# Patient Record
Sex: Male | Born: 1985 | Race: White | Hispanic: No | Marital: Married | Smoking: Never smoker
Health system: Southern US, Community
[De-identification: ages and names within clinical notes are randomized; demographics above are authoritative.]

## PROBLEM LIST (undated history)

## (undated) DIAGNOSIS — G35 Multiple sclerosis: Secondary | ICD-10-CM

## (undated) DIAGNOSIS — G4730 Sleep apnea, unspecified: Secondary | ICD-10-CM

## (undated) HISTORY — PX: TONSILLECTOMY: SUR1361

## (undated) HISTORY — DX: Sleep apnea, unspecified: G47.30

## (undated) HISTORY — DX: Multiple sclerosis: G35

---

## 2009-02-11 ENCOUNTER — Emergency Department: Payer: Self-pay | Admitting: Emergency Medicine

## 2010-11-26 ENCOUNTER — Ambulatory Visit: Payer: Self-pay | Admitting: Ophthalmology

## 2013-12-27 ENCOUNTER — Emergency Department: Payer: Self-pay | Admitting: Emergency Medicine

## 2014-03-26 ENCOUNTER — Emergency Department: Payer: Self-pay | Admitting: Emergency Medicine

## 2014-12-19 ENCOUNTER — Encounter: Payer: Self-pay | Admitting: *Deleted

## 2014-12-19 ENCOUNTER — Emergency Department
Admission: EM | Admit: 2014-12-19 | Discharge: 2014-12-19 | Disposition: A | Discharge status: Home / Self Care | Payer: BLUE CROSS/BLUE SHIELD | Attending: Emergency Medicine | Admitting: Emergency Medicine

## 2014-12-19 DIAGNOSIS — R202 Paresthesia of skin: Secondary | ICD-10-CM | POA: Diagnosis not present

## 2014-12-19 DIAGNOSIS — R200 Anesthesia of skin: Secondary | ICD-10-CM | POA: Diagnosis present

## 2014-12-19 DIAGNOSIS — Z79899 Other long term (current) drug therapy: Secondary | ICD-10-CM | POA: Diagnosis not present

## 2014-12-19 DIAGNOSIS — Z880 Allergy status to penicillin: Secondary | ICD-10-CM | POA: Insufficient documentation

## 2014-12-19 NOTE — ED Provider Notes (Signed)
[facility] Emergency Department Provider Note  ____________________________________________  Time seen: Approximately 11 PM  I have reviewed the triage vital signs and the nursing notes.   HISTORY  Chief Complaint Numbness    HPI Franklin Ortiz is a 29 y.o. male with a history of optic neuritis in his left eye 2 years ago who presents today with worsening lower extremity bilateral numbness.  Patient said he went to the beach last week and started having numbness and tingling to the bilateral feet laterally. Over the past week this weakness has extended up his bilateral legs outside of the legs. He denies any weakness or back pain. He denies any recent diarrheal illness or vaccinations. No formal diagnosis of MS. York Spaniel had a workup with MRI several years ago which were negative. No loss of bowel or bladder continence.   History reviewed. No pertinent past medical history.  There are no active problems to display for this patient.   History reviewed. No pertinent past surgical history.  Current Outpatient Rx  Name  Route  Sig  Dispense  Refill  . loratadine (CLARITIN) 10 MG tablet   Oral   Take 10 mg by mouth daily as needed for allergies.         . multivitamin (ONE-A-DAY MEN'S) TABS tablet   Oral   Take 1 tablet by mouth daily.           Allergies Ibuprofen; Penicillins; and Sulfa antibiotics  History reviewed. No pertinent family history.  Social History History  Substance Use Topics  . Smoking status: Never Smoker   . Smokeless tobacco: Not on file  . Alcohol Use: Yes    Review of Systems  Constitutional: No fever/chills Eyes: No visual changes. ENT: No sore throat. Cardiovascular: Denies chest pain. Respiratory: Denies shortness of breath. Gastrointestinal: No abdominal pain.  No nausea   BP 12/19/14 1023 119/106 mmHg     Pulse Rate 12/19/14 1023 82     Resp 12/19/14 1023 17     Temp 12/19/14 1023 98.1 F (36.7 C)   Temp Source 12/19/14 1023 Oral     SpO2 12/19/14 1023 98 %     Weight 12/19/14 1023 268 lb (121.564 kg)     Height 12/19/14 1023 6' (1.829 m)     Head Cir --      Peak Flow --      Pain Score --      Pain Loc --      Pain Edu? --      Excl. in GC? --     Constitutional: Alert and oriented. Well appearing and in no acute distress. Eyes: Conjunctivae are normal. PERRL. EOMI. Head: Atraumatic. Nose: No congestion/rhinnorhea. Mouth/Throat: Mucous membranes are moist.  Oropharynx non-erythematous. Neck: No stridor.   Cardiovascular: Normal rate, regular rhythm. Grossly normal heart sounds.  Good peripheral circulation. Respiratory: Normal respiratory effort.  No retractions. Lungs CTAB. Gastrointestinal: Soft and nontender. No distention. No abdominal bruits. No CVA tenderness. Musculoskeletal: No lower extremity tenderness nor edema.  No joint effusions. Neurologic:  Normal speech and language. speech and gait are normal. Has mildly decreased sensation to light touch to bilateral lower extremities laterally and medially. Mild distal saddle anesthesia. No tenderness to lower back. There is a negative straight leg raise bilaterally. Bilateral 5 out of 5 strength to the lower extremities.  Skin:  Skin is warm, dry and intact. No rash noted. Psychiatric: Mood and affect are normal. Speech and behavior are normal.  ____________________________________________  LABS (all labs ordered are listed, but only abnormal results are displayed)  Labs Reviewed - No data to display ____________________________________________  EKG   ____________________________________________  RADIOLOGY    ____________________________________________   PROCEDURES    ____________________________________________   INITIAL IMPRESSION / ASSESSMENT AND PLAN / ED COURSE  Pertinent labs & imaging results that were available during my care of the patient were reviewed by me and considered in my medical  decision making (see chart for details).  Discussed case with Dr. Loretha Brasil.  Does not recommend any steroids at this time. Appropriate for outpatient workup given symptomatology. Will follow-up with Dr. Clelia Croft in the clinic. Discussed this with patient and family who is willing to comply with this plan. Discussed the patient does have about a 40% chance of MS. I believe this is the primary cause of the symptoms at this time. ____________________________________________   FINAL CLINICAL IMPRESSION(S) / ED DIAGNOSES Acute paresthesia, initial visit.     Arelia Longest, MD 12/19/14 [phone]

## 2014-12-19 NOTE — ED Notes (Signed)
Pt states while at the beach last wedneday having burning sensation in BL feet and since has progessed, this morning woke with numbness/loss of sensation from waist down..denies injury or pain..motor funciton intact

## 2014-12-26 DIAGNOSIS — R29898 Other symptoms and signs involving the musculoskeletal system: Secondary | ICD-10-CM | POA: Insufficient documentation

## 2014-12-26 DIAGNOSIS — Z8669 Personal history of other diseases of the nervous system and sense organs: Secondary | ICD-10-CM | POA: Insufficient documentation

## 2014-12-26 DIAGNOSIS — R200 Anesthesia of skin: Secondary | ICD-10-CM | POA: Insufficient documentation

## 2014-12-27 ENCOUNTER — Other Ambulatory Visit: Payer: Self-pay | Admitting: Neurology

## 2014-12-27 DIAGNOSIS — M6281 Muscle weakness (generalized): Secondary | ICD-10-CM

## 2014-12-27 DIAGNOSIS — R200 Anesthesia of skin: Secondary | ICD-10-CM

## 2014-12-27 DIAGNOSIS — Z8669 Personal history of other diseases of the nervous system and sense organs: Secondary | ICD-10-CM

## 2014-12-31 ENCOUNTER — Ambulatory Visit: Admission: RE | Admit: 2014-12-31 | Payer: BLUE CROSS/BLUE SHIELD | Source: Ambulatory Visit

## 2015-01-02 ENCOUNTER — Ambulatory Visit
Admission: RE | Admit: 2015-01-02 | Discharge: 2015-01-02 | Disposition: A | Discharge status: Home / Self Care | Payer: BLUE CROSS/BLUE SHIELD | Source: Ambulatory Visit | Attending: Neurology | Admitting: Neurology

## 2015-01-02 DIAGNOSIS — R200 Anesthesia of skin: Secondary | ICD-10-CM | POA: Diagnosis present

## 2015-01-02 DIAGNOSIS — J329 Chronic sinusitis, unspecified: Secondary | ICD-10-CM | POA: Diagnosis not present

## 2015-01-02 DIAGNOSIS — R9089 Other abnormal findings on diagnostic imaging of central nervous system: Secondary | ICD-10-CM | POA: Diagnosis not present

## 2015-01-02 DIAGNOSIS — Z8669 Personal history of other diseases of the nervous system and sense organs: Secondary | ICD-10-CM | POA: Insufficient documentation

## 2015-01-02 DIAGNOSIS — M6281 Muscle weakness (generalized): Secondary | ICD-10-CM | POA: Diagnosis present

## 2015-01-02 MED ORDER — GADOBENATE DIMEGLUMINE 529 MG/ML IV SOLN
20.0000 mL | Freq: Once | INTRAVENOUS | Status: AC | PRN
  Administered 2015-01-02: 20 mL via INTRAVENOUS

## 2015-01-03 ENCOUNTER — Other Ambulatory Visit: Payer: Self-pay | Admitting: Neurology

## 2015-01-03 DIAGNOSIS — R29898 Other symptoms and signs involving the musculoskeletal system: Secondary | ICD-10-CM

## 2015-01-11 ENCOUNTER — Ambulatory Visit
Admission: RE | Admit: 2015-01-11 | Discharge: 2015-01-11 | Disposition: A | Discharge status: Home / Self Care | Payer: BLUE CROSS/BLUE SHIELD | Source: Ambulatory Visit | Attending: Neurology | Admitting: Neurology

## 2015-01-11 DIAGNOSIS — R200 Anesthesia of skin: Secondary | ICD-10-CM | POA: Insufficient documentation

## 2015-01-11 DIAGNOSIS — F1010 Alcohol abuse, uncomplicated: Secondary | ICD-10-CM | POA: Insufficient documentation

## 2015-01-11 DIAGNOSIS — R9089 Other abnormal findings on diagnostic imaging of central nervous system: Secondary | ICD-10-CM | POA: Insufficient documentation

## 2015-01-11 DIAGNOSIS — R29898 Other symptoms and signs involving the musculoskeletal system: Secondary | ICD-10-CM

## 2015-01-11 DIAGNOSIS — Z8669 Personal history of other diseases of the nervous system and sense organs: Secondary | ICD-10-CM | POA: Insufficient documentation

## 2015-01-11 LAB — CBC
HCT: 44.7 % (ref 40.0–52.0)
Hemoglobin: 15.4 g/dL (ref 13.0–18.0)
MCH: 29.7 pg (ref 26.0–34.0)
MCHC: 34.4 g/dL (ref 32.0–36.0)
MCV: 86.3 fL (ref 80.0–100.0)
Platelets: 224 10*3/uL (ref 150–440)
RBC: 5.18 MIL/uL (ref 4.40–5.90)
RDW: 13.2 % (ref 11.5–14.5)
WBC: 7.3 10*3/uL (ref 3.8–10.6)

## 2015-01-11 LAB — CSF CELL COUNT WITH DIFFERENTIAL
Lymphs, CSF: 91 % — ABNORMAL HIGH (ref 40–80)
Monocyte-Macrophage-Spinal Fluid: 3 % — ABNORMAL LOW (ref 15–45)
RBC Count, CSF: 951 /mm3 — ABNORMAL HIGH
Segmented Neutrophils-CSF: 6 % (ref 0–6)
Tube #: 3
WBC, CSF: 35 /mm3 (ref 0–5)

## 2015-01-11 LAB — PROTIME-INR
INR: 0.89
Prothrombin Time: 12.3 seconds (ref 11.4–15.0)

## 2015-01-11 LAB — GLUCOSE, CSF: Glucose, CSF: 55 mg/dL (ref 40–70)

## 2015-01-11 LAB — PROTEIN, CSF: Total  Protein, CSF: 66 mg/dL — ABNORMAL HIGH (ref 15–45)

## 2015-01-11 LAB — ALBUMIN: Albumin: 3.9 g/dL (ref 3.5–5.0)

## 2015-01-12 LAB — PATHOLOGIST SMEAR REVIEW

## 2015-01-12 LAB — IGG: IgG (Immunoglobin G), Serum: 1207 mg/dL (ref 700–1600)

## 2015-01-13 LAB — MISC LABCORP TEST (SEND OUT)
Labcorp test code: 139340
Labcorp test code: 19216

## 2015-01-14 LAB — MISC LABCORP TEST (SEND OUT): Labcorp test code: 828928

## 2015-01-16 LAB — CSF CULTURE W GRAM STAIN: Culture: NO GROWTH

## 2015-01-16 LAB — CSF CULTURE

## 2016-01-02 ENCOUNTER — Other Ambulatory Visit: Payer: Self-pay | Admitting: Physician Assistant

## 2016-01-02 DIAGNOSIS — G35 Multiple sclerosis: Secondary | ICD-10-CM

## 2016-01-16 ENCOUNTER — Ambulatory Visit
Admission: RE | Admit: 2016-01-16 | Discharge: 2016-01-16 | Disposition: A | Discharge status: Home / Self Care | Payer: BLUE CROSS/BLUE SHIELD | Source: Ambulatory Visit | Attending: Physician Assistant | Admitting: Physician Assistant

## 2016-01-16 DIAGNOSIS — M50223 Other cervical disc displacement at C6-C7 level: Secondary | ICD-10-CM | POA: Insufficient documentation

## 2016-01-16 DIAGNOSIS — G35 Multiple sclerosis: Secondary | ICD-10-CM | POA: Diagnosis present

## 2016-01-16 DIAGNOSIS — M5124 Other intervertebral disc displacement, thoracic region: Secondary | ICD-10-CM | POA: Diagnosis not present

## 2016-01-16 MED ORDER — GADOBENATE DIMEGLUMINE 529 MG/ML IV SOLN
20.0000 mL | Freq: Once | INTRAVENOUS | Status: AC | PRN
  Administered 2016-01-16: 20 mL via INTRAVENOUS

## 2016-01-30 ENCOUNTER — Ambulatory Visit
Admission: RE | Admit: 2016-01-30 | Discharge: 2016-01-30 | Disposition: A | Discharge status: Home / Self Care | Payer: BLUE CROSS/BLUE SHIELD | Source: Ambulatory Visit | Attending: Physician Assistant | Admitting: Physician Assistant

## 2016-01-30 DIAGNOSIS — G35 Multiple sclerosis: Secondary | ICD-10-CM | POA: Diagnosis not present

## 2016-01-30 MED ORDER — GADOBENATE DIMEGLUMINE 529 MG/ML IV SOLN
20.0000 mL | Freq: Once | INTRAVENOUS | Status: AC | PRN
  Administered 2016-01-30: 20 mL via INTRAVENOUS

## 2016-07-30 IMAGING — MR MR HEAD WO/W CM
13 series · 48 of 48 positions shown · IV contrast (20ml Multihance)
Comparison: MRI of the brain and orbits 11/26/2010.

CLINICAL DATA: Numbness in both lower extremity for 3 weeks. Muscle
weakness. Personal history of optic neuritis.

EXAM:
MRI HEAD WITHOUT AND WITH CONTRAST
TECHNIQUE: Multiplanar, multiecho pulse sequences of the brain and surrounding
structures were obtained without and with intravenous contrast.
CONTRAST:  20mL MULTIHANCE GADOBENATE DIMEGLUMINE 529 MG/ML IV SOLN

[Series 2: T1 · sagittal · 5.0mm · 0.49mm/px · 2 of 29 slices shown (1 of 2)]
[im 1/29]
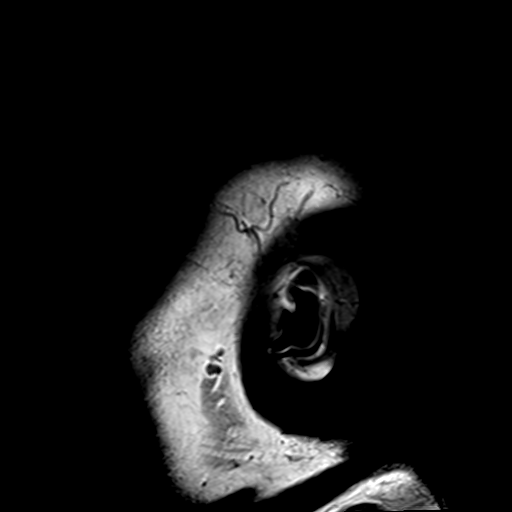
[im 29/29]
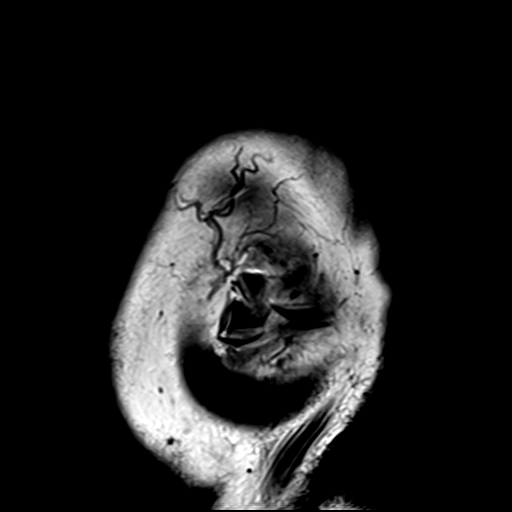

[Series 4: DWI · axial · 3.0mm · 1.95mm/px · z∈[-55,+116]mm · 5 of 59 slices shown (1 of 4)]
[im 1/59]
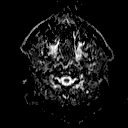
[im 15/59]
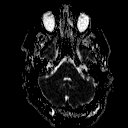
[im 30/59]
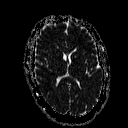
[im 44/59]
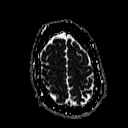
[im 59/59]
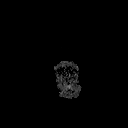

[Series 6: DWI · coronal · 3.0mm · 1.80mm/px · 5 of 55 slices shown (2 of 4)]
[im 1/55]
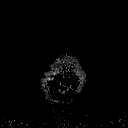
[im 14/55]
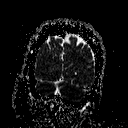
[im 28/55]
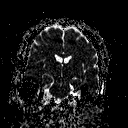
[im 41/55]
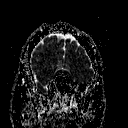
[im 55/55]
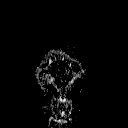

[Series 7: T2 · axial · 5.0mm · 0.65mm/px · z∈[-60,+107]mm · 2 of 27 slices shown (1 of 2)]
[im 1/27]
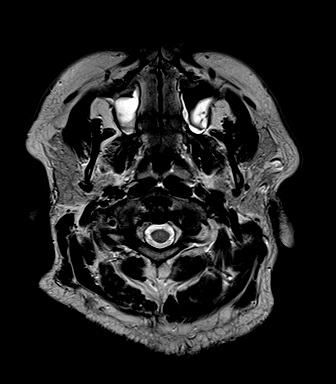
[im 27/27]
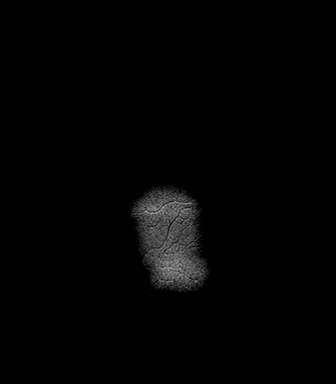

[Series 8: FLAIR · axial · 5.0mm · 0.49mm/px · z∈[-59,+108]mm · 2 of 27 slices shown (1 of 2)]
[im 1/27]
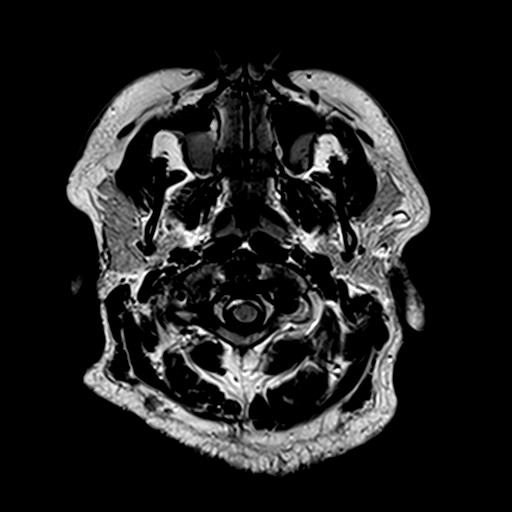
[im 27/27]
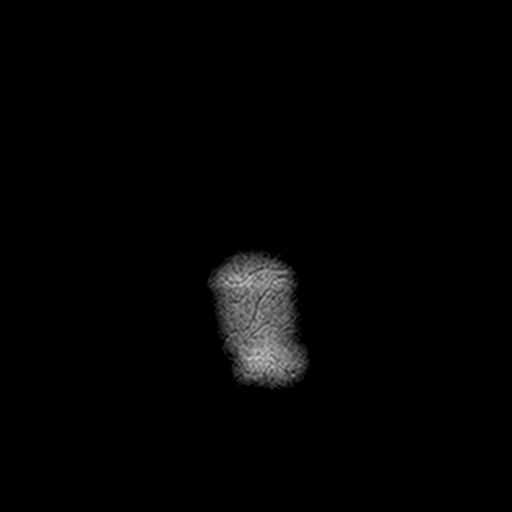

[Series 9: FLAIR · sagittal · 5.0mm · 0.49mm/px · 2 of 25 slices shown (2 of 2)]
[im 1/25]
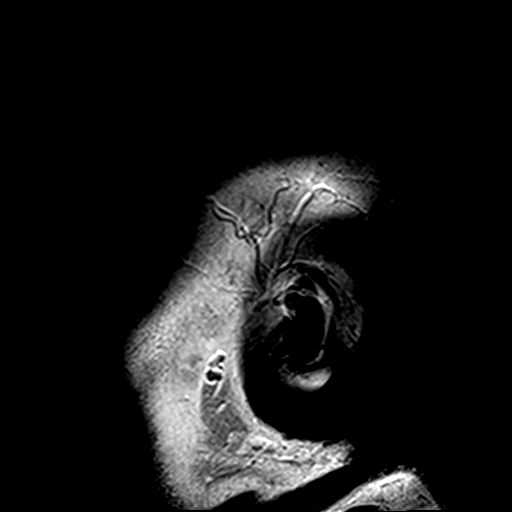
[im 25/25]
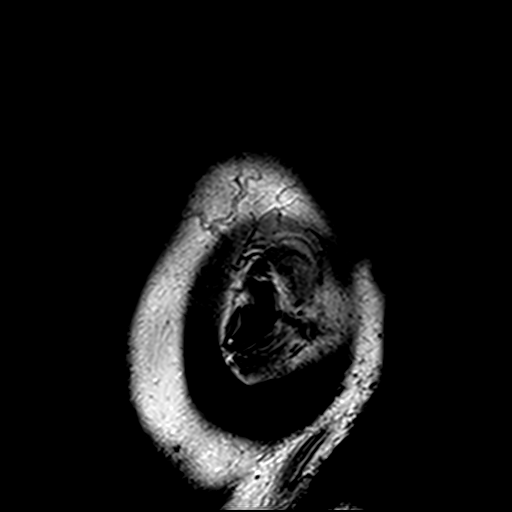

[Series 10: T2 · axial · 5.0mm · 0.49mm/px · z∈[-60,+107]mm · 2 of 27 slices shown (2 of 2)]
[im 1/27]
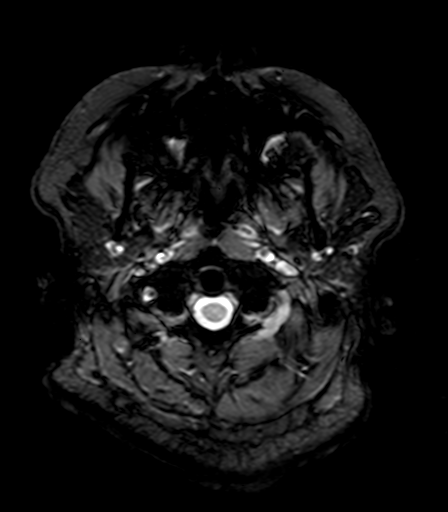
[im 27/27]
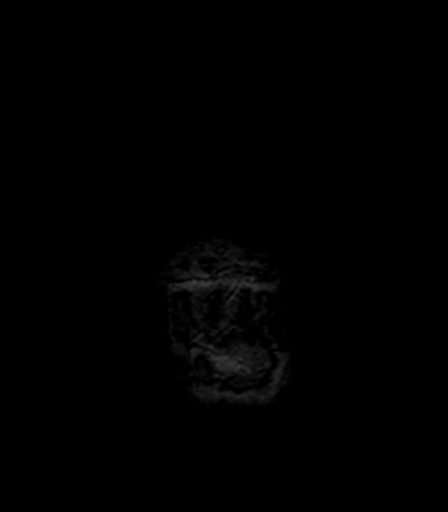

[Series 11: T1 · axial · 3.0mm · 1.00mm/px · z∈[-70,+117]mm · 6 of 64 slices shown (2 of 2)]
[im 1/64]
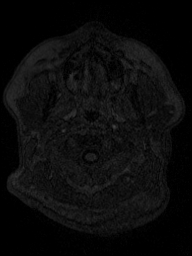
[im 13/64]
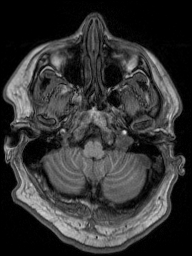
[im 26/64]
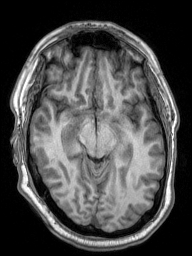
[im 38/64]
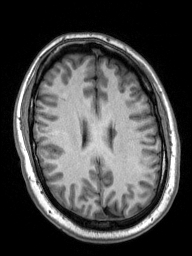
[im 51/64]
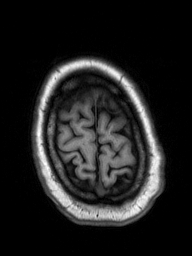
[im 64/64]
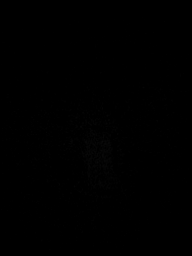

[Series 12: T2 post-contrast · coronal · 5.0mm · 0.49mm/px · 3 of 33 slices shown]
[im 1/33]
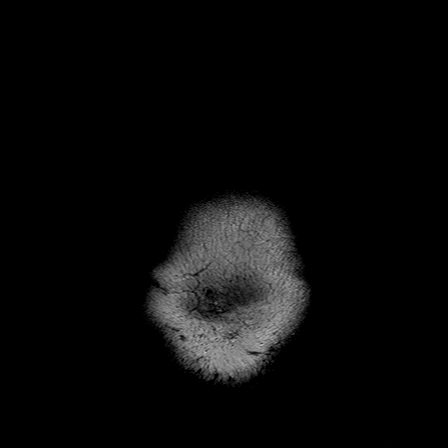
[im 17/33]
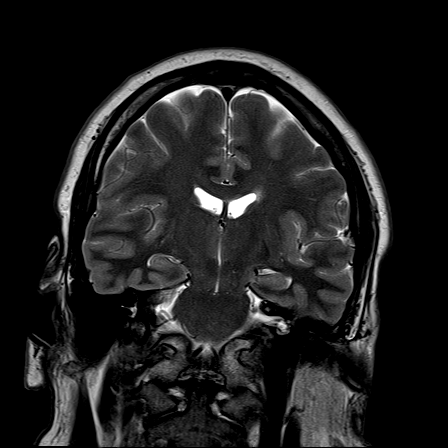
[im 33/33]
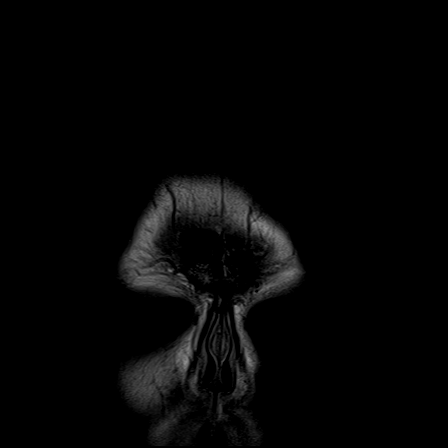

[Series 13: T1 post-contrast · axial · 3.0mm · 1.00mm/px · z∈[-70,+117]mm · 6 of 64 slices shown (1 of 2)]
[im 1/64]
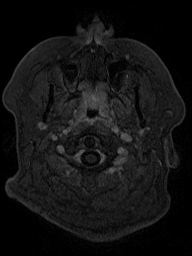
[im 13/64]
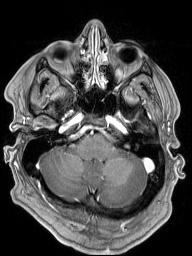
[im 26/64]
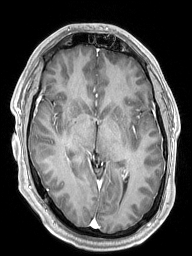
[im 38/64]
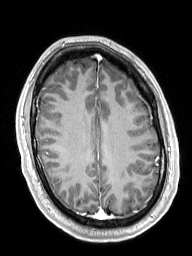
[im 51/64]
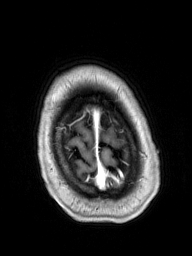
[im 64/64]
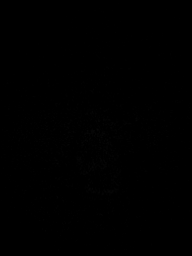

[Series 14: T1 post-contrast · coronal · 5.0mm · 0.43mm/px · 3 of 33 slices shown (2 of 2)]
[im 1/33]
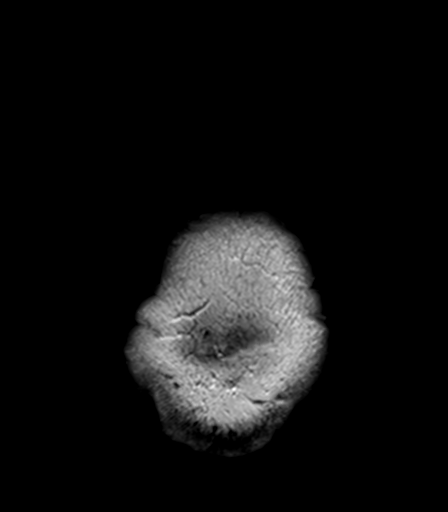
[im 17/33]
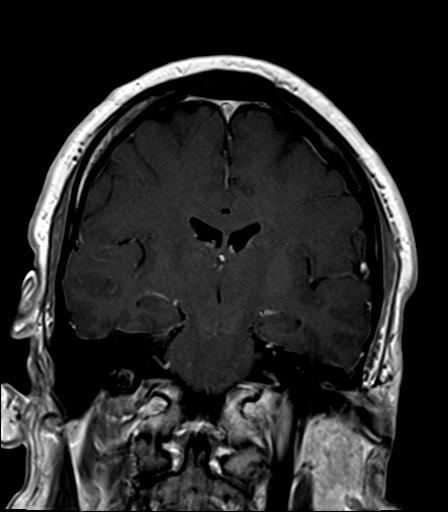
[im 33/33]
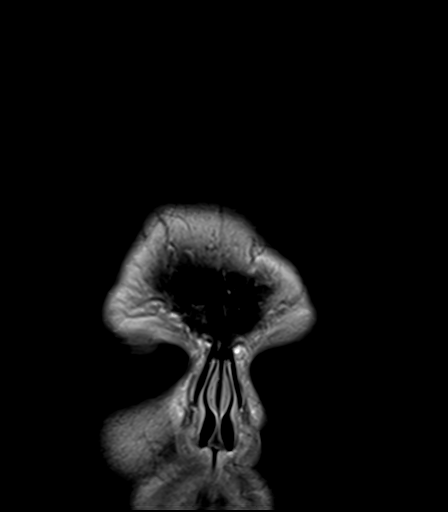

[Series 100: DWI · axial · 3.0mm · 1.95mm/px · z∈[-55,+116]mm · 5 of 58 slices shown (3 of 4)]
[im 1/58]
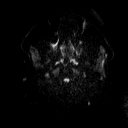
[im 15/58]
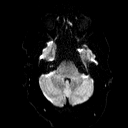
[im 29/58]
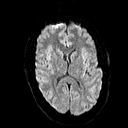
[im 43/58]
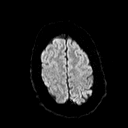
[im 58/58]
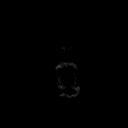

[Series 101: DWI · coronal · 3.0mm · 1.80mm/px · 5 of 55 slices shown (4 of 4)]
[im 1/55]
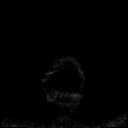
[im 14/55]
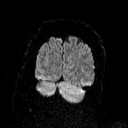
[im 28/55]
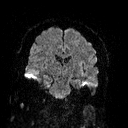
[im 41/55]
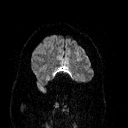
[im 55/55]
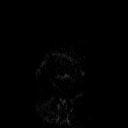

[48 of 48 positions shown; findings below may reference images not displayed]

FINDINGS: Three or four T2 hyperintensities are evident along the colossal
septal margin. No significant subcortical white matter changes are
evident. The largest lesion is a left periventricular T2
hyperintensity which measures 11 mm. This lesion was not clearly
present on the prior study. However, I do not have all of the images
from that study.

No acute infarct, hemorrhage, or mass lesion is present. The
ventricles are of normal size. No significant extraaxial fluid
collection is present.

Flow is present in the major intracranial arteries. The globes and
orbits are intact. Mucosal thickening is present in the anterior
ethmoid air cells bilaterally. There is mucosal disease in the left
frontal sinus and at floor of the maxillary sinuses bilaterally.

The postcontrast images demonstrate no pathologic enhancement.
IMPRESSION: 1. Bilateral periventricular T2 hyperintensities involving the
colossal septal margin. These are nonspecific, but can be seen in
the setting of a demyelinating process such as multiple sclerosis.
2. No acute intracranial abnormality.
3. Scattered sinus disease.

## 2017-01-21 ENCOUNTER — Other Ambulatory Visit: Payer: Self-pay | Admitting: Psychiatry

## 2017-01-21 DIAGNOSIS — G35 Multiple sclerosis: Secondary | ICD-10-CM

## 2017-02-05 ENCOUNTER — Ambulatory Visit
Admission: RE | Admit: 2017-02-05 | Discharge: 2017-02-05 | Disposition: A | Discharge status: Home / Self Care | Payer: BLUE CROSS/BLUE SHIELD | Source: Ambulatory Visit | Attending: Psychiatry | Admitting: Psychiatry

## 2017-02-05 DIAGNOSIS — G35 Multiple sclerosis: Secondary | ICD-10-CM

## 2017-02-05 MED ORDER — GADOBENATE DIMEGLUMINE 529 MG/ML IV SOLN
20.0000 mL | Freq: Once | INTRAVENOUS | Status: AC | PRN
  Administered 2017-02-05: 20 mL via INTRAVENOUS

## 2018-02-18 ENCOUNTER — Ambulatory Visit: Payer: BLUE CROSS/BLUE SHIELD | Admitting: Podiatry

## 2018-02-18 ENCOUNTER — Encounter: Payer: Self-pay | Admitting: Podiatry

## 2018-02-18 ENCOUNTER — Other Ambulatory Visit: Payer: Self-pay

## 2018-02-18 ENCOUNTER — Ambulatory Visit (INDEPENDENT_AMBULATORY_CARE_PROVIDER_SITE_OTHER): Payer: BLUE CROSS/BLUE SHIELD

## 2018-02-18 ENCOUNTER — Other Ambulatory Visit: Payer: Self-pay | Admitting: Podiatry

## 2018-02-18 VITALS — BP 150/91 | HR 76

## 2018-02-18 DIAGNOSIS — M79672 Pain in left foot: Secondary | ICD-10-CM

## 2018-02-18 DIAGNOSIS — M7662 Achilles tendinitis, left leg: Secondary | ICD-10-CM

## 2018-02-18 DIAGNOSIS — E669 Obesity, unspecified: Secondary | ICD-10-CM | POA: Insufficient documentation

## 2018-02-18 DIAGNOSIS — J45909 Unspecified asthma, uncomplicated: Secondary | ICD-10-CM | POA: Insufficient documentation

## 2018-02-18 DIAGNOSIS — J309 Allergic rhinitis, unspecified: Secondary | ICD-10-CM | POA: Insufficient documentation

## 2018-02-18 MED ORDER — METHYLPREDNISOLONE 4 MG PO TBPK: ORAL_TABLET | ORAL | 0 refills | Status: DC

## 2018-02-22 NOTE — Progress Notes (Signed)
   HPI: 32 year old male presenting today as a new patient with a chief complaint of pain to the posterior left heel that began gradually three weeks ago. He states the pain lasted for about 5 days and resolved but returned suddenly afterwards. He has been stretching the area but it seems to only make the pain worse. Touching the area and driving increase the pain. Patient is here for further evaluation and treatment.   History reviewed. No pertinent past medical history.    Physical Exam: General: The patient is alert and oriented x3 in no acute distress.  Dermatology: Skin is warm, dry and supple bilateral lower extremities. Negative for open lesions or macerations.  Vascular: Palpable pedal pulses bilaterally. No edema or erythema noted. Capillary refill within normal limits.  Neurological: Epicritic and protective threshold grossly intact bilaterally.   Musculoskeletal Exam: Pain on palpation noted to the posterior tubercle of the left calcaneus at the insertion of the Achilles tendon consistent with retrocalcaneal bursitis. Range of motion within normal limits. Muscle strength 5/5 in all muscle groups bilateral lower extremities.  Radiographic Exam:  Posterior calcaneal spur noted to the respective calcaneus on lateral view. No fracture or dislocation noted. Normal osseous mineralization noted.     Assessment: 1. Insertional Achilles tendinitis left 2. Retrocalcaneal bursitis   Plan of Care:  1. Patient was evaluated. Radiographs were reviewed today. 2. Allergy to NSAIDs.  3. Prescription for Medrol Dose Pak provided to patient.  4. Prescription for antiinflammatory pain cream provided to patient to be dispensed by [facility].  5. Stressed importance of stretching. Recommended good shoe gear.  6. Return to clinic in 4 weeks.   Felecia Shelling, DPM [facility]  Dr. Felecia Shelling, DPM    [address]                                          [place], [place] [address]                Office [phone]  Fax [phone]

## 2018-03-18 ENCOUNTER — Ambulatory Visit: Payer: BLUE CROSS/BLUE SHIELD | Admitting: Podiatry

## 2018-10-14 ENCOUNTER — Ambulatory Visit: Payer: BLUE CROSS/BLUE SHIELD | Admitting: Pulmonary Disease

## 2018-10-14 ENCOUNTER — Encounter: Payer: Self-pay | Admitting: Pulmonary Disease

## 2018-10-14 DIAGNOSIS — E668 Other obesity: Secondary | ICD-10-CM | POA: Diagnosis not present

## 2018-10-14 DIAGNOSIS — G4719 Other hypersomnia: Secondary | ICD-10-CM | POA: Diagnosis not present

## 2018-10-14 DIAGNOSIS — R0683 Snoring: Secondary | ICD-10-CM | POA: Diagnosis not present

## 2018-10-14 NOTE — Patient Instructions (Signed)
Home sleep study ordered After results are available, we will contact you with results, initiate treatment if indicated and arrange follow-up

## 2018-10-14 NOTE — Progress Notes (Signed)
PULMONARY/SLEEP CONSULT NOTE  Requesting MD/Service: Self-referred.  Primary physician is Tarri Abernethy MD Date of initial consultation: 10/14/18 Reason for consultation: Suspected OSA  PT PROFILE: 33 y.o. male never smoker referred for evaluation of possible OSA  DATA:  INTERVAL:  HPI:  He reports heavy snoring for many years and frequent nocturnal awakenings with a choking sensation.  He has an Epworth sleepiness scale of 11.  He denies morning headache.  He has no history of hypertension or erectile dysfunction.  He has been heavy all of his life.  At age 89 he weighed approximately 270 pounds.  10 years ago he weighed approximately 285 pounds.  His present weight is 347 pounds.  Past Medical History:  Diagnosis Date  . MS (multiple sclerosis) (HCC)     Past Surgical History:  Procedure Laterality Date  . TONSILLECTOMY      MEDICATIONS: I have reviewed all medications and confirmed regimen as documented  Social History   Socioeconomic History  . Marital status: Married    Spouse name: Not on file  . Number of children: Not on file  . Years of education: Not on file  . Highest education level: Not on file  Occupational History  . Not on file  Social Needs  . Financial resource strain: Not on file  . Food insecurity:    Worry: Not on file    Inability: Not on file  . Transportation needs:    Medical: Not on file    Non-medical: Not on file  Tobacco Use  . Smoking status: Never Smoker  . Smokeless tobacco: Never Used  Substance and Sexual Activity  . Alcohol use: Yes    Comment: occ  . Drug use: No  . Sexual activity: Not on file  Lifestyle  . Physical activity:    Days per week: Not on file    Minutes per session: Not on file  . Stress: Not on file  Relationships  . Social connections:    Talks on phone: Not on file    Gets together: Not on file    Attends religious service: Not on file    Active member of club or organization: Not on file   Attends meetings of clubs or organizations: Not on file    Relationship status: Not on file  . Intimate partner violence:    Fear of current or ex partner: Not on file    Emotionally abused: Not on file    Physically abused: Not on file    Forced sexual activity: Not on file  Other Topics Concern  . Not on file  Social History Narrative  . Not on file    Family History  Problem Relation Age of Onset  . Diabetes Mother   . COPD Mother     ROS: No fever, myalgias/arthralgias, unexplained weight loss or weight gain No new focal weakness or sensory deficits No otalgia, hearing loss, visual changes, nasal and sinus symptoms, mouth and throat problems No neck pain or adenopathy No abdominal pain, N/V/D, diarrhea, change in bowel pattern No dysuria, change in urinary pattern   Vitals:   10/14/18 1338  BP: 140/82  Pulse: 95  SpO2: 95%  Weight: (!) 347 lb (157.4 kg)  Height: 6' (1.829 m)  Room air   EXAM:  Gen: Obese, NAD HEENT: NCAT, sclerae white,  Neck: No JVD noted Lungs: breath sounds full, no wheezes or other adventitious sounds Cardiovascular: RRR, no murmurs Abdomen: Soft, nontender, normal BS Ext: without clubbing, cyanosis,  edema Neuro: grossly intact Skin: Limited exam, no lesions noted   DATA:   No flowsheet data found.  CBC Latest Ref Rng & Units 01/11/2015  WBC 3.8 - 10.6 K/uL 7.3  Hemoglobin 13.0 - 18.0 g/dL 54.6  Hematocrit 56.8 - 52.0 % 44.7  Platelets 150 - 440 K/uL 224    CXR:  No recent film  I have personally reviewed all chest radiographs reported above including CXRs and CT chest unless otherwise indicated  IMPRESSION:     ICD-10-CM   1. Daytime hypersomnolence G47.19 Home sleep test  2. Moderate obesity E66.8   3. Snoring R06.83 Home sleep test     PLAN:  Home sleep study ordered. We discussed the importance of weight loss in the management of OSA After sleep study results are available to [place] we will contact him with results,  initiate therapy as indicated and arrange for follow-up   Billy Fischer, MD PCCM service Mobile [phone] Pager [phone] 10/15/2018 4:38 PM

## 2018-10-15 ENCOUNTER — Encounter: Payer: Self-pay | Admitting: Pulmonary Disease

## 2018-10-19 DIAGNOSIS — G4733 Obstructive sleep apnea (adult) (pediatric): Secondary | ICD-10-CM

## 2018-10-21 ENCOUNTER — Telehealth: Payer: Self-pay

## 2018-10-21 DIAGNOSIS — G4733 Obstructive sleep apnea (adult) (pediatric): Secondary | ICD-10-CM

## 2018-10-21 DIAGNOSIS — R0683 Snoring: Secondary | ICD-10-CM

## 2018-10-21 DIAGNOSIS — G4719 Other hypersomnia: Secondary | ICD-10-CM

## 2018-10-21 NOTE — Telephone Encounter (Signed)
Spoke to patient for sleep study results.  AHI 29.0/hr. Severe OSA, nocturnal hypoxemia.   Recommend auto-CPAP with pressure range of 5-20 cm H2O, recommend ONO once patient is started on CPAP.   Patient aware, orders entered.

## 2020-01-17 NOTE — Progress Notes (Signed)
Scheduled to complete physical on 01/25/20 - no provider available. Needs to reschedule visit with provider.  AMD

## 2020-01-18 ENCOUNTER — Other Ambulatory Visit: Payer: Self-pay

## 2020-01-18 ENCOUNTER — Ambulatory Visit: Payer: Self-pay

## 2020-01-18 DIAGNOSIS — Z0000 Encounter for general adult medical examination without abnormal findings: Secondary | ICD-10-CM

## 2020-01-18 LAB — POCT URINALYSIS DIPSTICK
Bilirubin, UA: NEGATIVE
Blood, UA: NEGATIVE
Glucose, UA: NEGATIVE
Ketones, UA: NEGATIVE
Leukocytes, UA: NEGATIVE
Nitrite, UA: NEGATIVE
Protein, UA: NEGATIVE
Spec Grav, UA: 1.015 (ref 1.010–1.025)
Urobilinogen, UA: 0.2 E.U./dL
pH, UA: 6.5 (ref 5.0–8.0)

## 2020-01-19 LAB — CMP12+LP+TP+TSH+6AC+CBC/D/PLT
ALT: 31 IU/L (ref 0–44)
AST: 19 IU/L (ref 0–40)
Albumin/Globulin Ratio: 1.3 (ref 1.2–2.2)
Albumin: 4.3 g/dL (ref 4.0–5.0)
Alkaline Phosphatase: 90 IU/L (ref 48–121)
BUN/Creatinine Ratio: 11 (ref 9–20)
BUN: 11 mg/dL (ref 6–20)
Basophils Absolute: 0.1 10*3/uL (ref 0.0–0.2)
Basos: 1 %
Bilirubin Total: 0.4 mg/dL (ref 0.0–1.2)
Calcium: 9.2 mg/dL (ref 8.7–10.2)
Chloride: 104 mmol/L (ref 96–106)
Chol/HDL Ratio: 6.1 ratio — ABNORMAL HIGH (ref 0.0–5.0)
Cholesterol, Total: 183 mg/dL (ref 100–199)
Creatinine, Ser: 0.98 mg/dL (ref 0.76–1.27)
EOS (ABSOLUTE): 0.1 10*3/uL (ref 0.0–0.4)
Eos: 1 %
Estimated CHD Risk: 1.3 times avg. — ABNORMAL HIGH (ref 0.0–1.0)
Free Thyroxine Index: 1.8 (ref 1.2–4.9)
GFR calc Af Amer: 114 mL/min/{1.73_m2} (ref >59)
GFR calc non Af Amer: 99 mL/min/{1.73_m2} (ref >59)
GGT: 38 IU/L (ref 0–65)
Globulin, Total: 3.2 g/dL (ref 1.5–4.5)
Glucose: 100 mg/dL — ABNORMAL HIGH (ref 65–99)
HDL: 30 mg/dL — ABNORMAL LOW (ref >39)
Hematocrit: 46.7 % (ref 37.5–51.0)
Hemoglobin: 16.2 g/dL (ref 13.0–17.7)
Immature Grans (Abs): 0 10*3/uL (ref 0.0–0.1)
Immature Granulocytes: 0 %
Iron: 84 ug/dL (ref 38–169)
LDH: 192 IU/L (ref 121–224)
LDL Chol Calc (NIH): 126 mg/dL — ABNORMAL HIGH (ref 0–99)
Lymphocytes Absolute: 1.5 10*3/uL (ref 0.7–3.1)
Lymphs: 21 %
MCH: 29.8 pg (ref 26.6–33.0)
MCHC: 34.7 g/dL (ref 31.5–35.7)
MCV: 86 fL (ref 79–97)
Monocytes Absolute: 0.5 10*3/uL (ref 0.1–0.9)
Monocytes: 8 %
Neutrophils Absolute: 4.7 10*3/uL (ref 1.4–7.0)
Neutrophils: 69 %
Phosphorus: 3.1 mg/dL (ref 2.8–4.1)
Platelets: 254 10*3/uL (ref 150–450)
Potassium: 4.5 mmol/L (ref 3.5–5.2)
RBC: 5.43 x10E6/uL (ref 4.14–5.80)
RDW: 13.1 % (ref 11.6–15.4)
Sodium: 137 mmol/L (ref 134–144)
T3 Uptake Ratio: 23 % — ABNORMAL LOW (ref 24–39)
T4, Total: 8 ug/dL (ref 4.5–12.0)
TSH: 1.71 u[IU]/mL (ref 0.450–4.500)
Total Protein: 7.5 g/dL (ref 6.0–8.5)
Triglycerides: 148 mg/dL (ref 0–149)
Uric Acid: 7.2 mg/dL (ref 3.8–8.4)
VLDL Cholesterol Cal: 27 mg/dL (ref 5–40)
WBC: 6.9 10*3/uL (ref 3.4–10.8)

## 2020-01-31 ENCOUNTER — Ambulatory Visit: Payer: Self-pay | Admitting: Emergency Medicine

## 2020-01-31 NOTE — Progress Notes (Signed)
     I have reviewed the triage vital signs and the nursing notes.   HISTORY  Chief Complaint No chief complaint on file.  HPI Franklin Ortiz is a 34 y.o. male is here for annual physical.        Past Medical History:  Diagnosis Date  . MS (multiple sclerosis) [facility]     Patient Active Problem List   Diagnosis Date Noted  . Reactive airway disease 02/18/2018  . Obesity, unspecified 02/18/2018  . Allergic rhinitis 02/18/2018  . Weakness of both lower extremities 12/26/2014  . Numbness in feet 12/26/2014  . Hx of optic neuritis 12/26/2014    Past Surgical History:  Procedure Laterality Date  . TONSILLECTOMY      Prior to Admission medications   Medication Sig Start Date End Date Taking? Authorizing Provider  Dimethyl Fumarate (TECFIDERA) 240 MG CPDR Tecfidera 240 mg capsule,delayed release    [provider]  DULoxetine (CYMBALTA) 30 MG capsule Take by mouth. 09/10/17   [provider]  loratadine (CLARITIN) 10 MG tablet Take 10 mg by mouth daily as needed for allergies.    [provider]  multivitamin (ONE-A-DAY MEN'S) TABS tablet Take 1 tablet by mouth daily.    [provider]  NON FORMULARY [facility]  Achilles Tendonitis Cream- Diclofenac 3%, Baclofen 2%, Bupivacaine 1%, Doxepin 5%, Gabapentin 6%, Ibuprofen 3%, Pentoxifylline 3% Apply 1-2 grams to affected area 3-4 times daily Qty. 120 gm 3 refills    [provider]    Allergies Ibuprofen, Penicillins, and Sulfa antibiotics  Family History  Problem Relation Age of Onset  . Diabetes Mother   . COPD Mother     Social History Social History   Tobacco Use  . Smoking status: Never Smoker  . Smokeless tobacco: Never Used  Substance Use Topics  . Alcohol use: Yes    Comment: occ  . Drug use: No    Review of Systems Constitutional: No fever/chills Eyes: No visual changes. ENT: No complaints. Cardiovascular: Denies chest pain. Respiratory:  Denies shortness of breath. Gastrointestinal: No abdominal pain.  No nausea, no vomiting.  Genitourinary: Negative for dysuria. Musculoskeletal: Negative for back pain. Skin: Negative for rash. Neurological: Negative for headaches, focal weakness or numbness.  ____________________________________________   PHYSICAL EXAM: Constitutional: Alert and oriented. Well appearing and in no acute distress. Eyes: Conjunctivae are normal. PERRL. EOMI. Head: Atraumatic. Nose: No congestion/rhinnorhea. Neck: No stridor.   Cardiovascular: Normal rate, regular rhythm. Grossly normal heart sounds.  Good peripheral circulation. Respiratory: Normal respiratory effort.  No retractions. Lungs CTAB. Gastrointestinal: Soft and nontender. No distention.  Bowel sounds normoactive x4 quadrants. Musculoskeletal: No lower extremity tenderness nor edema.  No joint effusions. Neurologic:  Normal speech and language. No gross focal neurologic deficits are appreciated. No gait instability. Skin:  Skin is warm, dry and intact. No rash noted. Psychiatric: Mood and affect are normal. Speech and behavior are normal.  ____________________________________________   LABS (all labs ordered are listed, but only abnormal results are displayed)   ____________________________________________   FINAL CLINICAL IMPRESSION(S) / ED DIAGNOSES  Annual physical maintenance.   ED Discharge Orders         Ordered    EKG 12-Lead        Pending           Note:  This document was prepared using Dragon voice recognition software and may include unintentional dictation errors.

## 2020-06-27 ENCOUNTER — Ambulatory Visit (INDEPENDENT_AMBULATORY_CARE_PROVIDER_SITE_OTHER): Payer: Managed Care, Other (non HMO)

## 2020-06-27 ENCOUNTER — Ambulatory Visit: Payer: Managed Care, Other (non HMO) | Admitting: Podiatry

## 2020-06-27 ENCOUNTER — Other Ambulatory Visit: Payer: Self-pay

## 2020-06-27 DIAGNOSIS — M7671 Peroneal tendinitis, right leg: Secondary | ICD-10-CM

## 2020-06-27 MED ORDER — METHYLPREDNISOLONE 4 MG PO TBPK: ORAL_TABLET | ORAL | 0 refills | Status: DC

## 2020-06-27 MED ORDER — MELOXICAM 15 MG PO TABS: 15.0000 mg | ORAL_TABLET | Freq: Every day | ORAL | 1 refills | Status: DC

## 2020-06-27 NOTE — Progress Notes (Signed)
   HPI: 34 y.o. male presenting today as a new patient for evaluation of right lateral foot pain this been going on for the past few months.  Patient denies a history of injury to the area.  He states that the pain began and over the last 2 months it actually has improved from a 7/10 to a 2/10 on a daily basis.  He presents for further treatment evaluation  Past Medical History:  Diagnosis Date  . MS (multiple sclerosis) (HCC)      Physical Exam: General: The patient is alert and oriented x3 in no acute distress.  Dermatology: Skin is warm, dry and supple bilateral lower extremities. Negative for open lesions or macerations.  Vascular: Palpable pedal pulses bilaterally. No edema or erythema noted. Capillary refill within normal limits.  Neurological: Epicritic and protective threshold grossly intact bilaterally.   Musculoskeletal Exam: Range of motion within normal limits to all pedal and ankle joints bilateral. Muscle strength 5/5 in all groups bilateral.   Radiographic Exam:  Normal osseous mineralization. Joint spaces preserved. No fracture/dislocation/boney destruction.  Os trigonum versus shepherd's fracture noted on lateral view.  This area is asymptomatic clinically  Assessment: 1.  Insertional peroneal tendinitis right   Plan of Care:  1. Patient evaluated. X-Rays reviewed.  2.  Patient declined injections today.  He also declined the immobilization cam boot 3.  Prescription for Medrol Dosepak 4.  Prescription for meloxicam 15 mg daily 5.  Continue wearing good supportive shoes 6.  Return to clinic as needed  [facility] emergency management services      Felecia Shelling, DPM [facility]  Dr. Felecia Shelling, DPM    [address] [place], [place] [address]                Office [phone]  Fax [phone]

## 2020-06-28 ENCOUNTER — Ambulatory Visit: Payer: BLUE CROSS/BLUE SHIELD | Admitting: Podiatry

## 2021-04-25 ENCOUNTER — Telehealth: Payer: Self-pay | Admitting: Internal Medicine

## 2021-04-25 NOTE — Telephone Encounter (Signed)
Called and spoke with pt and he was not sure of the name of the DME company that he uses.  Gave him the number for [employer] and he stated that he will reach out to them for his cpap supplies.

## 2022-06-27 ENCOUNTER — Ambulatory Visit
Admission: RE | Admit: 2022-06-27 | Discharge: 2022-06-27 | Disposition: A | Discharge status: Home / Self Care | Payer: 59 | Attending: Physician Assistant | Admitting: Physician Assistant

## 2022-06-27 ENCOUNTER — Ambulatory Visit
Admission: RE | Admit: 2022-06-27 | Discharge: 2022-06-27 | Disposition: A | Discharge status: Home / Self Care | Payer: 59 | Source: Ambulatory Visit | Attending: Physician Assistant | Admitting: Physician Assistant

## 2022-06-27 ENCOUNTER — Other Ambulatory Visit: Payer: Self-pay | Admitting: Physician Assistant

## 2022-06-27 DIAGNOSIS — R0689 Other abnormalities of breathing: Secondary | ICD-10-CM

## 2022-06-27 DIAGNOSIS — R053 Chronic cough: Secondary | ICD-10-CM | POA: Diagnosis present

## 2023-07-29 ENCOUNTER — Encounter: Payer: Self-pay | Admitting: Urology

## 2023-09-16 ENCOUNTER — Ambulatory Visit: Payer: 59 | Admitting: Nurse Practitioner

## 2023-09-16 ENCOUNTER — Encounter: Payer: Self-pay | Admitting: Nurse Practitioner

## 2023-09-16 DIAGNOSIS — G4733 Obstructive sleep apnea (adult) (pediatric): Secondary | ICD-10-CM

## 2023-09-16 DIAGNOSIS — R5383 Other fatigue: Secondary | ICD-10-CM | POA: Diagnosis not present

## 2023-09-16 DIAGNOSIS — Z131 Encounter for screening for diabetes mellitus: Secondary | ICD-10-CM

## 2023-09-16 DIAGNOSIS — G35 Multiple sclerosis: Secondary | ICD-10-CM | POA: Diagnosis not present

## 2023-09-16 DIAGNOSIS — Z13220 Encounter for screening for lipoid disorders: Secondary | ICD-10-CM

## 2023-09-16 NOTE — Assessment & Plan Note (Signed)
Ambiguous in nature will check basic labs inclusive of CBC, CMP, B12, vitamin D, testosterone, TSH.  If all is normal and patient is adherent to CPAP therapy consider increasing duloxetine from 20 mg to 30 mg.

## 2023-09-16 NOTE — Assessment & Plan Note (Signed)
Patient was followed by neurologist at [facility].  He needs a follow-up per his report.  He is currently on a kesimpta.  Continue medication as prescribed follow-up with neurology as recommended

## 2023-09-16 NOTE — Patient Instructions (Signed)
Nice to see you today Make a fasting lab appointment with in the next 2 weeks. This needs to be 930am or earlier  I have referred you to the pulmonologist for the sleep apnea management  Follow up with me in 1 year for a physical, sooner if you need me

## 2023-09-16 NOTE — Assessment & Plan Note (Signed)
History of the same.  Patient is adherent to CPAP therapy.  Ambulatory referral to pulmonology for further management with supplies

## 2023-09-16 NOTE — Assessment & Plan Note (Signed)
Pending A1c, lipid panel, TSH.

## 2023-09-16 NOTE — Progress Notes (Signed)
 New Patient Office Visit  Subjective    Patient ID: Franklin Ortiz, male    DOB: 1986/03/04  Age: 38 y.o. MRN: [id number]  CC:  Chief Complaint  Patient presents with   Establish Care    Frequent urination at times and to come up with a plan for his sleep apnea going forward   Lack of energy     Would like a full lab work up, concern that testerone levels may be low    HPI Franklin Ortiz presents to establish care   Mood: has been on cymbalta. Used for anxiety and [facility]  [facility]. Denies HI/SI/AVH  Lower extremity weakness (MS): has been seen in the past by neurology Dr. Genette coombes at [facility]. Currently maintained on Kesimpta   Osa: has a cpap but not followed by pul. He was followed by Dr. Creston at [facility]. States that he does use it evefy night. He feels that he gets adaequate rest. He will wak e up 2-3 and will be up for a few mins and then go back to sleep  Fatigue: he will go to bed around 10 and will be up around 6. He tradionally feels rested. He states that he does not feel sleepy but just blah. He states that it may be seasonal. States he has had the anxitey and a touch of depression but nothing over the top. States that he is not having any interest in his   Colonoscopy: too young  PSA: too young   Flu: refused  Covid: original series  PNA: too young  Shingles: too young  TDAP: within 10 years    Outpatient Encounter Medications as of 09/16/2023  Medication Sig   DULoxetine (CYMBALTA) 30 MG capsule Take by mouth.   Ofatumumab (KESIMPTA) 20 MG/0.4ML SOAJ Inject into the skin.   [DISCONTINUED] Dimethyl Fumarate (TECFIDERA) 240 MG CPDR Tecfidera 240 mg capsule,delayed release   [DISCONTINUED] loratadine (CLARITIN) 10 MG tablet Take 10 mg by mouth daily as needed for allergies.   [DISCONTINUED] meloxicam  (MOBIC ) 15 MG tablet Take 1 tablet (15 mg total) by mouth daily.   [DISCONTINUED] methylPREDNISolone  (MEDROL  DOSEPAK) 4  MG TBPK tablet 6 day dose pack - take as directed   [DISCONTINUED] multivitamin (ONE-A-DAY MEN'S) TABS tablet Take 1 tablet by mouth daily.   [DISCONTINUED] NON FORMULARY [facility]  [place]illes Tendonitis Cream- Diclofenac 3%, Baclofen 2%, Bupivacaine 1%, Doxepin 5%, Gabapentin 6%, Ibuprofen 3%, Pentoxifylline 3% Apply 1-2 grams to affected area 3-4 times daily Qty. 120 gm 3 refills   [DISCONTINUED] predniSONE (DELTASONE) 20 MG tablet Take one tablet by mouth with food 30 minutes prior to injection as directed.   No facility-administered encounter medications on file as of 09/16/2023.    Past Medical History:  Diagnosis Date   MS (multiple sclerosis) (HCC)    Sleep apnea     Past Surgical History:  Procedure Laterality Date   TONSILLECTOMY      Family History  Problem Relation Age of Onset   Diabetes Mother    COPD Mother    Cancer Father        skin cancer   Sleep apnea Father     Social History   Socioeconomic History   Marital status: Married    Spouse name: Charmaine   Number of children: 0   Years of education: Not on file   Highest education level: Not on file  Occupational History   Not on file  Tobacco Use  Smoking status: Never   Smokeless tobacco: Former  Building Services Engineer status: Never Used  Substance and Sexual Activity   Alcohol use: Yes    Comment: occ   Drug use: No   Sexual activity: Not on file  Other Topics Concern   Not on file  Social History Narrative   Fulltime: Insurance underwriter for [employer]    Social Drivers of Health   Financial Resource Strain: Not on file  Food Insecurity: Not on file  Transportation Needs: Not on file  Physical Activity: Not on file  Stress: Not on file  Social Connections: Not on file  Intimate Partner Violence: Not on file    Review of Systems  Constitutional:  Negative for chills and fever.  Respiratory:  Negative for shortness of breath.   Cardiovascular:  Negative for  chest pain and leg swelling.  Gastrointestinal:  Negative for abdominal pain, blood in stool, constipation, diarrhea, nausea and vomiting.       BM daily  Genitourinary:  Negative for dysuria and hematuria.  Neurological:  Negative for tingling and headaches.  Psychiatric/Behavioral:  Negative for hallucinations and suicidal ideas.         Objective    BP 128/68   Pulse 66   Temp 97.9 F (36.6 C) (Oral)   Ht 5' 11.26 (1.81 m)   Wt (!) 365 lb 3.2 oz (165.7 kg)   SpO2 97%   BMI 50.56 kg/m   Physical Exam Vitals and nursing note reviewed.  Constitutional:      Appearance: Normal appearance.  HENT:     Right Ear: Tympanic membrane, ear canal and external ear normal.     Left Ear: Tympanic membrane, ear canal and external ear normal.     Mouth/Throat:     Mouth: Mucous membranes are moist.     Pharynx: Oropharynx is clear.  Eyes:     Extraocular Movements: Extraocular movements intact.     Pupils: Pupils are equal, round, and reactive to light.  Cardiovascular:     Rate and Rhythm: Normal rate and regular rhythm.     Pulses:          Radial pulses are 2+ on the right side and 2+ on the left side.     Heart sounds: Normal heart sounds.  Pulmonary:     Effort: Pulmonary effort is normal.     Breath sounds: Normal breath sounds.  Musculoskeletal:     Right lower leg: No edema.     Left lower leg: No edema.  Lymphadenopathy:     Cervical: No cervical adenopathy.  Skin:    General: Skin is warm.  Neurological:     General: No focal deficit present.     Mental Status: He is alert.     Deep Tendon Reflexes:     Reflex Scores:      Bicep reflexes are 1+ on the right side and 1+ on the left side.      Patellar reflexes are 1+ on the right side and 1+ on the left side.    Comments: Bilateral upper and lower extremity strength 5/5  Psychiatric:        Mood and Affect: Mood normal.        Behavior: Behavior normal.        Thought Content: Thought content normal.         Judgment: Judgment normal.         Assessment & Plan:   Problem  List Items Addressed This Visit       Respiratory   OSA (obstructive sleep apnea)   History of the same.  Patient is adherent to CPAP therapy.  Ambulatory referral to pulmonology for further management with supplies      Relevant Orders   Ambulatory referral to Pulmonology     Nervous and Auditory   Multiple sclerosis [facility])   Patient was followed by neurologist at [facility].  He needs a follow-up per his report.  He is currently on a kesimpta.  Continue medication as prescribed follow-up with neurology as recommended      Relevant Orders   CBC   Comprehensive metabolic panel     Other   Morbid obesity (HCC) - Primary   Pending A1c, lipid panel, TSH      Relevant Orders   Hemoglobin A1c   TSH   Lipid panel   Other fatigue   Ambiguous in nature will check basic labs inclusive of CBC, CMP, B12, vitamin D , testosterone , TSH.  If all is normal and patient is adherent to CPAP therapy consider increasing duloxetine from 20 mg to 30 mg.      Relevant Orders   CBC   Comprehensive metabolic panel   TSH   VITAMIN D  25 Hydroxy (Vit-D Deficiency, Fractures)   Vitamin B12   Testosterone    Other Visit Diagnoses       Screening for lipid disorders         Screening for diabetes mellitus       Relevant Orders   Hemoglobin A1c   Lipid panel       Return in about 1 year (around 09/15/2024) for CPE and Labs.   Adina Crandall, NP

## 2023-09-22 ENCOUNTER — Other Ambulatory Visit: Payer: Managed Care, Other (non HMO)

## 2023-09-29 ENCOUNTER — Other Ambulatory Visit (INDEPENDENT_AMBULATORY_CARE_PROVIDER_SITE_OTHER): Payer: Managed Care, Other (non HMO)

## 2023-09-29 DIAGNOSIS — R5383 Other fatigue: Secondary | ICD-10-CM

## 2023-09-29 DIAGNOSIS — Z131 Encounter for screening for diabetes mellitus: Secondary | ICD-10-CM | POA: Diagnosis not present

## 2023-09-29 DIAGNOSIS — G35 Multiple sclerosis: Secondary | ICD-10-CM

## 2023-09-29 LAB — LIPID PANEL
Cholesterol: 165 mg/dL (ref 0–200)
HDL: 26.3 mg/dL — ABNORMAL LOW (ref >39.00)
LDL Cholesterol: 70 mg/dL (ref 0–99)
NonHDL: 138.98
Total CHOL/HDL Ratio: 6
Triglycerides: 347 mg/dL — ABNORMAL HIGH (ref 0.0–149.0)
VLDL: 69.4 mg/dL — ABNORMAL HIGH (ref 0.0–40.0)

## 2023-09-29 LAB — COMPREHENSIVE METABOLIC PANEL
ALT: 33 U/L (ref 0–53)
AST: 17 U/L (ref 0–37)
Albumin: 3.9 g/dL (ref 3.5–5.2)
Alkaline Phosphatase: 74 U/L (ref 39–117)
BUN: 12 mg/dL (ref 6–23)
CO2: 27 meq/L (ref 19–32)
Calcium: 8.6 mg/dL (ref 8.4–10.5)
Chloride: 103 meq/L (ref 96–112)
Creatinine, Ser: 0.85 mg/dL (ref 0.40–1.50)
GFR: 109.13 mL/min (ref >60.00)
Glucose, Bld: 120 mg/dL — ABNORMAL HIGH (ref 70–99)
Potassium: 4.2 meq/L (ref 3.5–5.1)
Sodium: 138 meq/L (ref 135–145)
Total Bilirubin: 0.5 mg/dL (ref 0.2–1.2)
Total Protein: 6.5 g/dL (ref 6.0–8.3)

## 2023-09-29 LAB — TSH: TSH: 2.1 u[IU]/mL (ref 0.35–5.50)

## 2023-09-29 LAB — CBC
HCT: 42.6 % (ref 39.0–52.0)
Hemoglobin: 15 g/dL (ref 13.0–17.0)
MCHC: 35.1 g/dL (ref 30.0–36.0)
MCV: 89.6 fL (ref 78.0–100.0)
Platelets: 243 10*3/uL (ref 150.0–400.0)
RBC: 4.76 Mil/uL (ref 4.22–5.81)
RDW: 14.5 % (ref 11.5–15.5)
WBC: 7.3 10*3/uL (ref 4.0–10.5)

## 2023-09-29 LAB — TESTOSTERONE: Testosterone: 277.18 ng/dL — ABNORMAL LOW (ref 300.00–890.00)

## 2023-09-29 LAB — VITAMIN D 25 HYDROXY (VIT D DEFICIENCY, FRACTURES): VITD: 17.45 ng/mL — ABNORMAL LOW (ref 30.00–100.00)

## 2023-09-29 LAB — VITAMIN B12: Vitamin B-12: 386 pg/mL (ref 211–911)

## 2023-09-29 LAB — HEMOGLOBIN A1C: Hgb A1c MFr Bld: 5 % (ref 4.6–6.5)

## 2023-09-30 ENCOUNTER — Institutional Professional Consult (permissible substitution): Payer: Managed Care, Other (non HMO) | Admitting: Sleep Medicine

## 2023-10-03 ENCOUNTER — Encounter: Payer: Self-pay | Admitting: Nurse Practitioner

## 2023-10-03 ENCOUNTER — Other Ambulatory Visit: Payer: Self-pay | Admitting: Nurse Practitioner

## 2023-10-03 DIAGNOSIS — E559 Vitamin D deficiency, unspecified: Secondary | ICD-10-CM

## 2023-10-03 DIAGNOSIS — R7989 Other specified abnormal findings of blood chemistry: Secondary | ICD-10-CM

## 2023-10-03 MED ORDER — VITAMIN D (ERGOCALCIFEROL) 1.25 MG (50000 UNIT) PO CAPS: 50000.0000 [IU] | ORAL_CAPSULE | ORAL | 0 refills | Status: AC

## 2023-10-06 ENCOUNTER — Encounter: Payer: Self-pay | Admitting: Sleep Medicine

## 2023-10-06 ENCOUNTER — Ambulatory Visit: Payer: Managed Care, Other (non HMO) | Admitting: Sleep Medicine

## 2023-10-06 VITALS — BP 128/82 | HR 72 | Temp 97.1°F | Ht 71.0 in | Wt 361.0 lb

## 2023-10-06 DIAGNOSIS — F5104 Psychophysiologic insomnia: Secondary | ICD-10-CM

## 2023-10-06 DIAGNOSIS — G4733 Obstructive sleep apnea (adult) (pediatric): Secondary | ICD-10-CM | POA: Diagnosis not present

## 2023-10-06 NOTE — Patient Instructions (Signed)
 Patient Instructions  Continue to use CPAP every night, minimum of 7-8 hours a night.  Change equipment every 30 days or as directed by DME.  Wash your tubing with warm soap and water daily, hang to dry. Wash humidifier portion weekly. Use distilled water and change daily.  Be aware of reduced alertness and do not drive or operate heavy machinery if experiencing this or drowsiness.  Exercise encouraged, as tolerated. Encouraged proper weight management.  Important to get eight or more hours of sleep  Limiting the use of the computer and television before bedtime.  Decrease naps during the day, so night time sleep will become enhanced.  Limit caffeine, and sleep deprivation.  HTN, stroke, uncontrolled diabetes and heart failure are potential risk factors.  Risk of untreated sleep apnea including cardiac arrhthymias, stroke, DM, pulm HTN.

## 2023-10-06 NOTE — Progress Notes (Signed)
 Name:Franklin Ortiz MRN: [id number] DOB: 10-23-1985   CHIEF COMPLAINT:  ESTABLISH CARE FOR OSA   HISTORY OF PRESENT ILLNESS:  Franklin Ortiz is a 38 y.o. w/ a h/o OSA, MS, morbid obesity and anxiety who presents to establish care for OSA. Reports that he was initially diagnosed with moderate to severe OSA (AHI 29, O2 nadir 71%) in 2020 and subsequently started on CPAP therapy. Reports using CPAP therapy every night, which is confirmed by compliance data. He is currently using the Airfit F20 FFM, which is comfortable. Denies air leaks or nasal congestion. Reports feeling more refreshed upon awakening with CPAP therapy. Reports nocturnal awakenings due to unclear reasons and occasionally has difficulty falling back to sleep. Reports a 20 lb weight gain over the last few years. Denies morning headaches, night sweats, dry mouth or RLS symptoms. Denies a family history of sleep apnea. Denies drowsy driving. Drinks 1-2 sodas daily, drinks 2-3 beers 2 nights per week, denies tobacco or illicit drug use.   Bedtime 10 pm Sleep onset 5 mins Rise time 6-6:30 am   EPWORTH SLEEP SCORE 1    10/06/2023   10:00 AM 10/14/2018    1:00 PM  Results of the Epworth flowsheet  Sitting and reading 0 2  Watching TV 0 2  Sitting, inactive in a public place (e.g. a theatre or a meeting) 0 1  As a passenger in a car for an hour without a break 0 0  Lying down to rest in the afternoon when circumstances permit 0 3  Sitting and talking to someone 0 0  Sitting quietly after a lunch without alcohol 1 3  In a car, while stopped for a few minutes in traffic 0 0  Total score 1 11     PAST MEDICAL HISTORY :   has a past medical history of MS (multiple sclerosis) (HCC) and Sleep apnea.  has a past surgical history that includes Tonsillectomy. Prior to Admission medications   Medication Sig Start Date End Date Taking? Authorizing Provider  DULoxetine (CYMBALTA) 30 MG capsule Take by mouth. 09/10/17  Yes  [provider]  Ofatumumab (KESIMPTA) 20 MG/0.4ML SOAJ Inject into the skin. 01/25/20  Yes [provider]  Vitamin D, Ergocalciferol, (DRISDOL) 1.25 MG (50000 UNIT) CAPS capsule Take 1 capsule (50,000 Units total) by mouth every 7 (seven) days. 10/03/23  Yes Eden Emms, NP   Allergies  Allergen Reactions   Ibuprofen Hives   Penicillins Other (See Comments)    Showed up on allergy test as a kid   Sulfa Antibiotics     Showed up on allergy test as a kid    FAMILY HISTORY:  family history includes COPD in his mother; Cancer in his father; Diabetes in his mother; Sleep apnea in his father. SOCIAL HISTORY:  reports that he has never smoked. He has quit using smokeless tobacco. He reports current alcohol use. He reports that he does not use drugs.   Review of Systems:  Gen:  Denies  fever, sweats, chills weight loss  HEENT: Denies blurred vision, double vision, ear pain, eye pain, hearing loss, nose bleeds, sore throat Cardiac:  No dizziness, chest pain or heaviness, chest tightness,edema, No JVD Resp:   No cough, -sputum production, -shortness of breath,-wheezing, -hemoptysis,  Gi: Denies swallowing difficulty, stomach pain, nausea or vomiting, diarrhea, constipation, bowel incontinence Gu:  Denies bladder incontinence, burning urine Ext:   Denies Joint pain, stiffness or swelling Skin: Denies  skin  rash, easy bruising or bleeding or hives Endoc:  Denies polyuria, polydipsia , polyphagia or weight change Psych:   Denies depression, insomnia or hallucinations  Other:  All other systems negative  VITAL SIGNS: BP 128/82 (BP Location: Right Wrist, Cuff Size: Large)   Pulse 72   Temp (!) 97.1 F (36.2 C)   Ht 5\' 11"  (1.803 m)   Wt (!) 361 lb (163.7 kg)   SpO2 97%   BMI 50.35 kg/m    Physical Examination:   General Appearance: No distress  EYES PERRLA, EOM intact.   NECK Supple, No JVD Pulmonary: normal breath sounds, No wheezing.  CardiovascularNormal  S1,S2.  No m/r/g.   Abdomen: Benign, Soft, non-tender. Skin:   warm, no rashes, no ecchymosis  Extremities: normal, no cyanosis, clubbing. Neuro:without focal findings,  speech normal  PSYCHIATRIC: Mood, affect within normal limits.   ASSESSMENT AND PLAN  OSA Patient is using and benefiting from CPAP therapy. CPAP supplies ordered. Discussed the consequences of untreated sleep apnea. Advised not to drive drowsy for safety of patient and others. Will follow up in 1 year to review CPAP efficacy and compliance data.   Morbid obesity Counseled patient on diet and lifestyle modification.  Insomnia Counseled patient on stimulus control and improving sleep hygiene practices.    Patient  satisfied with Plan of action and management. All questions answered  Follow up to review CPAP efficacy and compliance data.    I spent a total of 32 minutes reviewing chart data, face-to-face evaluation with the patient, counseling and coordination of care as detailed above.    Tempie Hoist, M.D.  Sleep Medicine [facility]

## 2023-10-23 ENCOUNTER — Other Ambulatory Visit (INDEPENDENT_AMBULATORY_CARE_PROVIDER_SITE_OTHER): Payer: Managed Care, Other (non HMO)

## 2023-10-23 DIAGNOSIS — R7989 Other specified abnormal findings of blood chemistry: Secondary | ICD-10-CM

## 2023-10-23 LAB — TESTOSTERONE: Testosterone: 236.7 ng/dL — ABNORMAL LOW (ref 300.00–890.00)

## 2023-10-27 ENCOUNTER — Encounter: Payer: Self-pay | Admitting: Nurse Practitioner

## 2023-10-27 DIAGNOSIS — R7989 Other specified abnormal findings of blood chemistry: Secondary | ICD-10-CM

## 2023-12-10 ENCOUNTER — Ambulatory Visit: Admitting: Urology

## 2023-12-31 ENCOUNTER — Ambulatory Visit: Admitting: Urology

## 2023-12-31 VITALS — BP 114/71 | HR 69 | Ht 72.0 in | Wt 355.0 lb

## 2023-12-31 DIAGNOSIS — R7989 Other specified abnormal findings of blood chemistry: Secondary | ICD-10-CM

## 2023-12-31 DIAGNOSIS — R351 Nocturia: Secondary | ICD-10-CM

## 2023-12-31 DIAGNOSIS — G4739 Other sleep apnea: Secondary | ICD-10-CM | POA: Diagnosis not present

## 2023-12-31 NOTE — Progress Notes (Signed)
 Franklin Ortiz,acting as a scribe for Franklin Gimenez, MD.,have documented all relevant documentation on the behalf of Franklin Gimenez, MD,as directed by  Franklin Gimenez, MD while in the presence of Franklin Gimenez, MD.  12/31/23 12:46 PM   Franklin Ortiz [birth year] [id number]  Referring provider: Dorothe Gaster, NP [address] [place],  [place] [address]  Chief Complaint  Patient presents with   Establish Care    Low testosterone      HPI: 38 year old male with a personal history of sleep apnea, morbid obesity with a BMI of 48, and multiple sclerosis presents for further evaluation of hypogonadism. He was initially evaluated in February for lack of energy and potential low testosterone  levels.   He reports nocturia and mild to moderate erectile dysfunction, with a significant decrease in sex drive.   Testosterone  levels were checked on October 09, 2023, and were low at 277 ng/dL. A follow-up test in March showed a level of 236.7 ng/dL. Both tests were conducted in the early morning. All other labs, including TSH were within normal limits.  He has not had a recent PSA test or further workup for hypogonadism.   He denies any desire for children, and his wife has had a hysterectomy. He is interested in exploring treatment options, including lifestyle changes and medication.   He reports urinary issues, particularly after alcohol consumption, but these are not present when he is not drinking.    Androgen Deficiency in the Aging Male     Row Name 12/31/23 0900         Androgen Deficiency in the Aging Male   Do you have a decrease in libido (sex drive) Yes     Do you have lack of energy Yes     Do you have a decrease in strength and/or endurance Yes     Have you lost height No     Have you noticed a decreased "enjoyment of life" Yes     Are you sad and/or grumpy Yes     Are your erections less strong Yes     Have you noticed a recent deterioration in your ability to  play sports Yes     Are you falling asleep after dinner No     Has there been a recent deterioration in your work performance No              SHIM     Row Name 12/31/23 0945         SHIM: Over the last 6 months:   How do you rate your confidence that you could get and keep an erection? Low     When you had erections with sexual stimulation, how often were your erections hard enough for penetration (entering your partner)? Sometimes (about half the time)     During sexual intercourse, how often were you able to maintain your erection after you had penetrated (entered) your partner? Sometimes (about half the time)     During sexual intercourse, how difficult was it to maintain your erection to completion of intercourse? Slightly Difficult     When you attempted sexual intercourse, how often was it satisfactory for you? Sometimes (about half the time)       SHIM Total Score   SHIM 15                 PMH: Past Medical History:  Diagnosis Date   MS (multiple sclerosis) (HCC)    Sleep apnea  Surgical History: Past Surgical History:  Procedure Laterality Date   TONSILLECTOMY      Home Medications:  Allergies as of 12/31/2023       Reactions   Ibuprofen Hives   Penicillins Other (See Comments)   Showed up on allergy test as a kid   Sulfa Antibiotics    Showed up on allergy test as a kid        Medication List        Accurate as of Dec 31, 2023 12:46 PM. If you have any questions, ask your nurse or doctor.          DULoxetine 30 MG capsule Commonly known as: CYMBALTA Take by mouth.   Kesimpta 20 MG/0.4ML Soaj Generic drug: Ofatumumab Inject into the skin.   Vitamin D  (Ergocalciferol ) 1.25 MG (50000 UNIT) Caps capsule Commonly known as: DRISDOL  Take 1 capsule (50,000 Units total) by mouth every 7 (seven) days.        Allergies:  Allergies  Allergen Reactions   Ibuprofen Hives   Penicillins Other (See Comments)    Showed up on allergy  test as a kid   Sulfa Antibiotics     Showed up on allergy test as a kid    Family History: Family History  Problem Relation Age of Onset   Diabetes Mother    COPD Mother    Cancer Father        skin cancer   Sleep apnea Father     Social History:  reports that he has never smoked. He has quit using smokeless tobacco. He reports current alcohol use. He reports that he does not use drugs.   Physical Exam: BP 114/71   Pulse 69   Ht 6' (1.829 m)   Wt (!) 355 lb (161 kg)   BMI 48.15 kg/m   Constitutional:  Alert and oriented, No acute distress. HEENT: [place] AT, moist mucus membranes.  Trachea midline, no masses. Neurologic: Grossly intact, no focal deficits, moving all 4 extremities. Psychiatric: Normal mood and affect.   Assessment & Plan:    1. Hypogonadism - His testosterone  levels were low at 277 ng/dL and 161.0 ng/dL on two separate occasions. The plan is to conduct further evaluation with labs including prolactin, FSH, LH, and estradiol to assess pituitary function and estrogen levels. - Discussed treatment options including lifestyle modifications and pharmacological interventions. He expressed interest in starting Clomid (Clomiphene) due to its safety profile and the ability to maintain natural testosterone  production. - Clomid will be prescribed at 25 mg daily, pending normal lab results. He will be advised to research pharmacies for cost-effective options and communicate the preferred pharmacy for prescription fulfillment. - Follow-up testosterone  levels and liver function tests (LFTs) will be checked in three months to assess response to treatment.  2. Nocturia and Urinary Symptoms - He reports nocturia and changes in urination, particularly after alcohol consumption. These symptoms are likely related to alcohol's diuretic effect and muscle relaxation properties, as well as potential age-related prostate enlargement. - A PSA test will be conducted to evaluate prostate  health. No immediate pharmacological intervention is planned unless symptoms become persistent and problematic.  3. Obesity and Sleep Apnea - He has a history of morbid obesity and sleep apnea, which may contribute to low testosterone  levels.  - Encouraged lifestyle modifications, including weight loss, which may improve testosterone  levels and overall health.  Return in about 3 months (around 04/01/2024) for PA visit for review of lab results and testosterone  management.  I have reviewed the above documentation for accuracy and completeness, and I agree with the above.   Franklin Gimenez, MD   [facility] [address], [address] [place], [place] [address] [phone]

## 2024-01-02 ENCOUNTER — Encounter: Payer: Self-pay | Admitting: Urology

## 2024-01-02 DIAGNOSIS — R972 Elevated prostate specific antigen [PSA]: Secondary | ICD-10-CM

## 2024-01-14 LAB — HORMONE PANEL (T4,TSH,FSH,TESTT,SHBG,DHEA,ETC)
DHEA-Sulfate, LCMS: 170 ug/dL
Estradiol, Serum, MS: 32 pg/mL
Estrone Sulfate: 83 ng/dL
Follicle Stimulating Hormone: 4.6 m[IU]/mL
Free T-3: 3.3 pg/mL
Free Testosterone, Serum: 66 pg/mL
Progesterone, Serum: <10 ng/dL
Sex Hormone Binding Globulin: 29.7 nmol/L
T4: 7.8 ug/dL
TSH: 1.6 uU/mL
Testosterone, Serum (Total): 288 ng/dL
Testosterone-% Free: 2.3 %
Triiodothyronine (T-3), Serum: 133 ng/dL

## 2024-01-14 LAB — PSA: Prostate Specific Ag, Serum: 5.9 ng/mL — ABNORMAL HIGH (ref 0.0–4.0)

## 2024-01-14 LAB — PROLACTIN: Prolactin: 14.3 ng/mL (ref 3.9–22.7)

## 2024-01-14 LAB — FSH/LH
FSH: 3.5 m[IU]/mL (ref 1.5–12.4)
LH: 4.5 m[IU]/mL (ref 1.7–8.6)

## 2024-01-29 ENCOUNTER — Other Ambulatory Visit

## 2024-01-29 DIAGNOSIS — R972 Elevated prostate specific antigen [PSA]: Secondary | ICD-10-CM

## 2024-01-30 LAB — PSA: Prostate Specific Ag, Serum: 1 ng/mL (ref 0.0–4.0)

## 2024-02-02 ENCOUNTER — Ambulatory Visit: Admitting: Physician Assistant

## 2024-02-02 VITALS — BP 148/94 | HR 91 | Ht 72.0 in | Wt 350.0 lb

## 2024-02-02 DIAGNOSIS — R972 Elevated prostate specific antigen [PSA]: Secondary | ICD-10-CM | POA: Diagnosis not present

## 2024-02-02 DIAGNOSIS — R7989 Other specified abnormal findings of blood chemistry: Secondary | ICD-10-CM | POA: Diagnosis not present

## 2024-02-02 MED ORDER — CLOMIPHENE CITRATE 50 MG PO TABS: ORAL_TABLET | ORAL | 3 refills | Status: AC

## 2024-02-02 NOTE — Progress Notes (Signed)
 02/02/2024 11:28 AM   Lonni CHRISTELLA Crawley Jan 01, 1986 [id number]  CC: Chief Complaint  Patient presents with   Elevated PSA   HPI: VERNAL HRITZ is a 38 y.o. male with PMH OSA, BMI >45, MS, and hypogonadism who presents today for follow-up.   He saw Dr. Penne last month and was planning to start Clomid, however routine labs showed a markedly elevated PSA for age at 5.9.  Today he reports no irritative voiding symptoms or antibiotics since his last visit.  Repeat PSA 4 days ago normalized to 1.0.  In retrospect, he recalls that he had sex with his wife twice on the morning of his last office visit.  PMH: Past Medical History:  Diagnosis Date   MS (multiple sclerosis) (HCC)    Sleep apnea     Surgical History: Past Surgical History:  Procedure Laterality Date   TONSILLECTOMY      Home Medications:  Allergies as of 02/02/2024       Reactions   Ibuprofen Hives   Penicillins Other (See Comments)   Showed up on allergy test as a kid   Sulfa Antibiotics    Showed up on allergy test as a kid        Medication List        Accurate as of February 02, 2024 11:28 AM. If you have any questions, ask your nurse or doctor.          DULoxetine 30 MG capsule Commonly known as: CYMBALTA Take by mouth.   Kesimpta 20 MG/0.4ML Soaj Generic drug: Ofatumumab Inject into the skin.   Vitamin D  (Ergocalciferol ) 1.25 MG (50000 UNIT) Caps capsule Commonly known as: DRISDOL  Take 1 capsule (50,000 Units total) by mouth every 7 (seven) days.        Allergies:  Allergies  Allergen Reactions   Ibuprofen Hives   Penicillins Other (See Comments)    Showed up on allergy test as a kid   Sulfa Antibiotics     Showed up on allergy test as a kid    Family History: Family History  Problem Relation Age of Onset   Diabetes Mother    COPD Mother    Cancer Father        skin cancer   Sleep apnea Father     Social History:   reports that he has never smoked. He has quit  using smokeless tobacco. He reports current alcohol use. He reports that he does not use drugs.  Physical Exam: BP (!) 148/94   Pulse 91   Ht 6' (1.829 m)   Wt (!) 350 lb (158.8 kg)   BMI 47.47 kg/m   Constitutional:  Alert and oriented, no acute distress, nontoxic appearing HEENT: [place], AT Cardiovascular: No clubbing, cyanosis, or edema Respiratory: Normal respiratory effort, no increased work of breathing GU: DRE declined Skin: No rashes, bruises or suspicious lesions Neurologic: Grossly intact, no focal deficits, moving all 4 extremities Psychiatric: Normal mood and affect  Laboratory Data: Results for orders placed or performed in visit on 01/29/24  PSA   Collection Time: 01/29/24 10:34 AM  Result Value Ref Range   Prostate Specific Ag, Serum 1.0 0.0 - 4.0 ng/mL   Assessment & Plan:   1. Low testosterone  (Primary) PSA has normalized, see below.  Okay to start Clomid at 25 mg daily.  We discussed the need for serial lab monitoring.  He is in agreement. - Testosterone ; Future - Hemoglobin and Hematocrit, Blood; Future - PSA; Future - Hepatic  function panel; Future - clomiPHENE (CLOMID) 50 MG tablet; Take 1/2 tablet daily  Dispense: 30 tablet; Refill: 3  2. Elevated PSA PSA has normalized.  No irritative voiding symptoms.  I suspect his PSA was falsely elevated due to to recent ejaculations.  Will continue to monitor.  Return in about 3 months (around 05/04/2024) for Lab visit for testosterone , PSA, H&H, LFTs.  Lucie Hones, PA-C  [facility] [place] [address] [place], [place] [address] [phone]

## 2024-04-01 ENCOUNTER — Ambulatory Visit: Admitting: Urology

## 2024-05-04 ENCOUNTER — Other Ambulatory Visit
